# Patient Record
Sex: Female | Born: 1965 | Race: White | Hispanic: No | Marital: Married | Smoking: Never smoker
Health system: Southern US, Community
[De-identification: ages and names within clinical notes are randomized; demographics above are authoritative.]

---

## 1997-12-27 ENCOUNTER — Ambulatory Visit (HOSPITAL_COMMUNITY): Admission: RE | Admit: 1997-12-27 | Discharge: 1997-12-27 | Payer: Self-pay | Admitting: Obstetrics and Gynecology

## 1998-02-06 ENCOUNTER — Inpatient Hospital Stay (HOSPITAL_COMMUNITY): Admission: AD | Admit: 1998-02-06 | Discharge: 1998-02-22 | Payer: Self-pay | Admitting: Obstetrics and Gynecology

## 1998-03-30 ENCOUNTER — Other Ambulatory Visit: Admission: RE | Admit: 1998-03-30 | Discharge: 1998-03-30 | Payer: Self-pay | Admitting: Obstetrics and Gynecology

## 1999-05-09 ENCOUNTER — Ambulatory Visit (HOSPITAL_COMMUNITY): Admission: RE | Admit: 1999-05-09 | Discharge: 1999-05-09 | Payer: Self-pay | Admitting: Obstetrics and Gynecology

## 1999-08-19 ENCOUNTER — Other Ambulatory Visit: Admission: RE | Admit: 1999-08-19 | Discharge: 1999-08-19 | Payer: Self-pay | Admitting: Obstetrics and Gynecology

## 1999-11-22 ENCOUNTER — Encounter: Payer: Self-pay | Admitting: Obstetrics and Gynecology

## 1999-11-22 ENCOUNTER — Inpatient Hospital Stay (HOSPITAL_COMMUNITY): Admission: AD | Admit: 1999-11-22 | Discharge: 1999-11-22 | Payer: Self-pay | Admitting: Obstetrics and Gynecology

## 2000-01-17 ENCOUNTER — Other Ambulatory Visit: Admission: RE | Admit: 2000-01-17 | Discharge: 2000-01-17 | Payer: Self-pay | Admitting: Obstetrics and Gynecology

## 2011-06-02 ENCOUNTER — Ambulatory Visit (HOSPITAL_BASED_OUTPATIENT_CLINIC_OR_DEPARTMENT_OTHER)
Admission: RE | Admit: 2011-06-02 | Discharge: 2011-06-02 | Disposition: A | Discharge status: Home / Self Care | Payer: BC Managed Care – PPO | Source: Ambulatory Visit | Attending: Family Medicine | Admitting: Family Medicine

## 2011-06-02 ENCOUNTER — Encounter: Payer: Self-pay | Admitting: Family Medicine

## 2011-06-02 ENCOUNTER — Ambulatory Visit (INDEPENDENT_AMBULATORY_CARE_PROVIDER_SITE_OTHER): Payer: BC Managed Care – PPO | Admitting: Family Medicine

## 2011-06-02 VITALS — BP 124/77 | HR 79 | Ht 63.0 in | Wt 156.6 lb

## 2011-06-02 DIAGNOSIS — M79671 Pain in right foot: Secondary | ICD-10-CM

## 2011-06-02 DIAGNOSIS — M2010 Hallux valgus (acquired), unspecified foot: Secondary | ICD-10-CM | POA: Insufficient documentation

## 2011-06-02 DIAGNOSIS — M25579 Pain in unspecified ankle and joints of unspecified foot: Secondary | ICD-10-CM

## 2011-06-02 DIAGNOSIS — M949 Disorder of cartilage, unspecified: Secondary | ICD-10-CM | POA: Insufficient documentation

## 2011-06-02 DIAGNOSIS — M7730 Calcaneal spur, unspecified foot: Secondary | ICD-10-CM | POA: Insufficient documentation

## 2011-06-02 DIAGNOSIS — M79609 Pain in unspecified limb: Secondary | ICD-10-CM | POA: Insufficient documentation

## 2011-06-02 DIAGNOSIS — M25571 Pain in right ankle and joints of right foot: Secondary | ICD-10-CM | POA: Insufficient documentation

## 2011-06-02 DIAGNOSIS — M899 Disorder of bone, unspecified: Secondary | ICD-10-CM | POA: Insufficient documentation

## 2011-06-02 NOTE — Patient Instructions (Signed)
You have an ankle sprain. Ice the area for 15 minutes at a time, 3-4 times a day Take aleve 1-2 tabs twice a day with food as needed. Use lace-up ankle brace when walking/exercising on uneven terrain. Start physical therapy for 2-3 visits to learn home exercise program and do these exercises most days of the week for the next 6 weeks. Ok to run as long as not limping and pain is less than a 3 on a scale of 1-10. Follow-up with me in 4 weeks or as needed.

## 2011-06-02 NOTE — Assessment & Plan Note (Signed)
Right ankle/foot sprain - x-rays reviewed and no evidence of fracture line on ap/lateral/oblique views of foot (where she has bony tenderness though minimal at base of 5th and ? Prominence). Start PT with transition to HEP. Continue ASO when on uneven terrain. Icing, tylenol, aleve, elevation as needed.

## 2011-06-02 NOTE — Progress Notes (Signed)
  Subjective:    Patient ID: Cassandra Mccormick, female    DOB: 1966-09-30, 45 y.o.   MRN: [id number]  PCP: Dr. Fuller Mandril  HPI 45 yo F here for right foot/ankle pain.  Patient reports 6 weeks ago while doing a mud run through a creek she inverted her right ankle. Unable to bear weight for a few days following this. Went to [facility] and had x-rays that she reports were negative for a fracture. Was on crutches for the first few days then eventually weaned off of these to an ASO. Was icing, elevating but not requiring this now. Walked for the first time for exercise this past weekend and felt some soreness outside right foot/ankle. Planning to run a half marathon in October and wanted to make sure she was progressing as expected with this. Occasionally taking advil. Has not done any rehab for this. No prior ankle injuries or surgeries.  History reviewed. No pertinent past medical history.  No current outpatient prescriptions on file prior to visit.    Past Surgical History  Procedure Date  . Cesarean section     No Known Allergies  History   Social History  . Marital Status: Married    Spouse Name: N/A    Number of Children: N/A  . Years of Education: N/A   Occupational History  . Not on file.   Social History Main Topics  . Smoking status: Never Smoker   . Smokeless tobacco: Not on file  . Alcohol Use: Not on file  . Drug Use: Not on file  . Sexually Active: Not on file   Other Topics Concern  . Not on file   Social History Narrative  . No narrative on file    Family History  Problem Relation Age of Onset  . Hypertension Mother   . Stroke Father     BP 124/77  Pulse 79  Ht 5\' 3"  (1.6 m)  Wt 156 lb 9.6 oz (71.033 kg)  BMI 27.74 kg/m2  Review of Systems See HPI above.    Objective:   Physical Exam Gen: NAD R foot/ankle: ?bony callus base 5th MT compared to left (feels more swollen and firm).  No bruising, swelling, erythema, other  deformity. Minimal TTP base 5th, at ATFL.  No TTP fibular head, malleoli, navicular, elsewhere foot or ankle. FROM ankle with 5/5 strength all motions. 1+ ant drawer and talar tilt (same on left but she reports feels unstable with both of these only on right). NVI distally.     Assessment & Plan:  1. Right ankle/foot sprain - x-rays reviewed and no evidence of fracture line on ap/lateral/oblique views of foot (where she has bony tenderness though minimal at base of 5th and ? Prominence).  Start PT with transition to HEP.  Continue ASO when on uneven terrain.  Icing, tylenol, aleve, elevation as needed.

## 2011-06-02 NOTE — Assessment & Plan Note (Signed)
Right ankle/foot sprain - x-rays reviewed and no evidence of fracture line on ap/lateral/oblique views of foot (where she has bony tenderness though minimal at base of 5th and ? Prominence). Start PT with transition to HEP. Continue ASO when on uneven terrain. Icing, tylenol, aleve, elevation as needed.  

## 2011-06-06 ENCOUNTER — Ambulatory Visit: Payer: Self-pay | Admitting: Family Medicine

## 2011-07-09 ENCOUNTER — Ambulatory Visit (INDEPENDENT_AMBULATORY_CARE_PROVIDER_SITE_OTHER): Payer: BC Managed Care – PPO | Admitting: Family Medicine

## 2011-07-09 ENCOUNTER — Encounter: Payer: Self-pay | Admitting: Family Medicine

## 2011-07-09 VITALS — BP 114/77 | HR 68 | Temp 98.5°F | Ht 63.0 in | Wt 151.0 lb

## 2011-07-09 DIAGNOSIS — M25579 Pain in unspecified ankle and joints of unspecified foot: Secondary | ICD-10-CM

## 2011-07-09 DIAGNOSIS — M25571 Pain in right ankle and joints of right foot: Secondary | ICD-10-CM

## 2011-07-09 NOTE — Assessment & Plan Note (Signed)
Patient is 3 months out from her inversion injury and still reporting fairly significant lateral ankle pain, swelling despite 6 weeks of PT/HEP.  Would expect more improvement than this at this point.  Will move forward with MRI to assess for stress fracture, talar dome fracture, or other abnormality to account for her lateral ankle pain and swelling that has persisted.  Icing, tylenol, aleve in meantime.  Will call her with results.

## 2011-07-09 NOTE — Progress Notes (Addendum)
Subjective:    Patient ID: Cassandra Mccormick, female    DOB: 12-06-1965, 45 y.o.   MRN: [id number]  PCP: Dr. Fuller Mandril  HPI  45 yo F here for f/u right foot/ankle pain.  7/16: Patient reports 6 weeks ago while doing a mud run through a creek she inverted her right ankle. Unable to bear weight for a few days following this. Went to [facility] and had x-rays that she reports were negative for a fracture. Was on crutches for the first few days then eventually weaned off of these to an ASO. Was icing, elevating but not requiring this now. Walked for the first time for exercise this past weekend and felt some soreness outside right foot/ankle. Planning to run a half marathon in October and wanted to make sure she was progressing as expected with this. Occasionally taking advil. Has not done any rehab for this. No prior ankle injuries or surgeries.  8/22: Patient returns for 6 week follow-up of right ankle pain. She has completed physical therapy and has been compliant with home exercise program. Not as active as she would like to be as she still reports pain in lateral ankle. Runs about 3 days a week with 4-5/10 pain lateral ankle and swelling following activity. Pain worse at nighttime. Taking aleve which helped a lot at first but not as much now. Icing after running. Affecting her sleep.  History reviewed. No pertinent past medical history.  No current outpatient prescriptions on file prior to visit.    Past Surgical History  Procedure Date  . Cesarean section     No Known Allergies  History   Social History  . Marital Status: Married    Spouse Name: N/A    Number of Children: N/A  . Years of Education: N/A   Occupational History  . Not on file.   Social History Main Topics  . Smoking status: Never Smoker   . Smokeless tobacco: Not on file  . Alcohol Use: Not on file  . Drug Use: Not on file  . Sexually Active: Not on file   Other Topics  Concern  . Not on file   Social History Narrative  . No narrative on file    Family History  Problem Relation Age of Onset  . Hypertension Mother   . Stroke Father     BP 114/77  Pulse 68  Temp(Src) 98.5 F (36.9 C) (Oral)  Ht 5\' 3"  (1.6 m)  Wt 151 lb (68.493 kg)  BMI 26.75 kg/m2  Review of Systems  See HPI above.    Objective:   Physical Exam  Gen: NAD R foot/ankle: No gross deformity, mild lateral ankle swelling.  No bruising, erythema, deformity. TTP lateral malleolus, over ATFL, lateral talar dome.  No base 5th, fibular head, medial malleolus, navicular, cuboid TTP. FROM ankle with 5/5 strength all motions. 1+ ant drawer and talar tilt. NVI distally.     Assessment & Plan:  1. Right ankle pain - Patient is 3 months out from her inversion injury and still reporting fairly significant lateral ankle pain, swelling despite 6 weeks of PT/HEP.  Would expect more improvement than this at this point.  Will move forward with MRI to assess for stress fracture, talar dome fracture, or other abnormality to account for her lateral ankle pain and swelling that has persisted.  Icing, tylenol, aleve in meantime.  Will call her with results.  Addendum:  MRI results reviewed - they do show a healing  nondisplaced cuboid fracture.  She came in today as I discussed I would like her to use a cam walker for next 6 weeks.  This is an unusual finding given she does not have tenderness here (examined again today and no tenderness over cuboid) and has an isolated cuboid fracture (? Calcaneal stress component).  Hopefully with rest over next 6 weeks her pain will completely resolve.  Do not think we need to move forward with surgical referral - these typically heal well without surgical intervention and MRI shows healing.  No running, elliptical, biking over next 4 weeks - consider adding biking if no pain in 4 weeks but will reassess in 2 weeks.  Not typical for pain to extend beyond 6-8 weeks but  this may be due to her activity level.  F/u in 2 weeks.

## 2011-07-11 ENCOUNTER — Ambulatory Visit
Admission: RE | Admit: 2011-07-11 | Discharge: 2011-07-11 | Disposition: A | Discharge status: Home / Self Care | Payer: BC Managed Care – PPO | Source: Ambulatory Visit | Attending: Family Medicine | Admitting: Family Medicine

## 2011-07-11 DIAGNOSIS — M25571 Pain in right ankle and joints of right foot: Secondary | ICD-10-CM

## 2011-07-24 ENCOUNTER — Ambulatory Visit (INDEPENDENT_AMBULATORY_CARE_PROVIDER_SITE_OTHER): Payer: BC Managed Care – PPO | Admitting: Family Medicine

## 2011-07-24 ENCOUNTER — Encounter: Payer: Self-pay | Admitting: Family Medicine

## 2011-07-24 VITALS — BP 93/50

## 2011-07-24 DIAGNOSIS — M79609 Pain in unspecified limb: Secondary | ICD-10-CM

## 2011-07-24 DIAGNOSIS — M79671 Pain in right foot: Secondary | ICD-10-CM

## 2011-07-24 NOTE — Progress Notes (Signed)
Subjective:    Patient ID: Cassandra Mccormick, female    DOB: 1966-04-27, 45 y.o.   MRN: [id number]  PCP: Dr. Fuller Mandril  Foot Pain   45 yo F here for f/u right foot/ankle pain.  7/16: Patient reports 6 weeks ago while doing a mud run through a creek she inverted her right ankle. Unable to bear weight for a few days following this. Went to [facility] and had x-rays that she reports were negative for a fracture. Was on crutches for the first few days then eventually weaned off of these to an ASO. Was icing, elevating but not requiring this now. Walked for the first time for exercise this past weekend and felt some soreness outside right foot/ankle. Planning to run a half marathon in October and wanted to make sure she was progressing as expected with this. Occasionally taking advil. Has not done any rehab for this. No prior ankle injuries or surgeries.  8/22: Patient returns for 6 week follow-up of right ankle pain. She has completed physical therapy and has been compliant with home exercise program. Not as active as she would like to be as she still reports pain in lateral ankle. Runs about 3 days a week with 4-5/10 pain lateral ankle and swelling following activity. Pain worse at nighttime. Taking aleve which helped a lot at first but not as much now. Icing after running. Affecting her sleep.  9/6: Patient has improved since last visit though mildly. No icing or medications regularly - occasionally taking aleve. Feels most stiff in the morning. Wearing cam walker regularly. Not doing any running, elliptical, cycling. Most of pain still at ankle and not at area of fracture noted on MRI.  History reviewed. No pertinent past medical history.  No current outpatient prescriptions on file prior to visit.    Past Surgical History  Procedure Date  . Cesarean section     No Known Allergies  History   Social History  . Marital Status: Married    Spouse  Name: N/A    Number of Children: N/A  . Years of Education: N/A   Occupational History  . Not on file.   Social History Main Topics  . Smoking status: Never Smoker   . Smokeless tobacco: Not on file  . Alcohol Use: Not on file  . Drug Use: Not on file  . Sexually Active: Not on file   Other Topics Concern  . Not on file   Social History Narrative  . No narrative on file    Family History  Problem Relation Age of Onset  . Hypertension Mother   . Stroke Father     BP 93/50  LMP 06/18/2011  Review of Systems  See HPI above.    Objective:   Physical Exam  Gen: NAD R foot/ankle: No gross deformity, minimal lateral ankle swelling.  No bruising, erythema, deformity. Mild TTP over ATFL, no TTP lateral malleolus, lateral talar dome.  No base 5th, fibular head, medial malleolus, navicular, cuboid TTP. FROM ankle with 5/5 strength all motions. 1+ ant drawer and talar tilt. NVI distally.     Assessment & Plan:  1. Right ankle pain - MRI showed cuboid fracture and possible stress reaction of calcaneus.  On exam again today patient is not tender here but treating her for these with 6 weeks of cam walker, relative rest (no weight bearing exercise).  Again, these typically heal well with conservative treatment and do not require surgical intervention.  F/u  in 4 weeks for reevaluation.  Advised her would continue with no running, elliptical, biking until follow-up.  Elevation, icing as needed.

## 2011-07-24 NOTE — Assessment & Plan Note (Signed)
MRI showed cuboid fracture and possible stress reaction of calcaneus.  On exam again today patient is not tender here but treating her for these with 6 weeks of cam walker, relative rest (no weight bearing exercise).  Again, these typically heal well with conservative treatment and do not require surgical intervention.  F/u in 4 weeks for reevaluation.  Advised her would continue with no running, elliptical, biking until follow-up.  Elevation, icing as needed.

## 2011-08-22 ENCOUNTER — Encounter: Payer: Self-pay | Admitting: Family Medicine

## 2011-08-22 ENCOUNTER — Ambulatory Visit (INDEPENDENT_AMBULATORY_CARE_PROVIDER_SITE_OTHER): Payer: BC Managed Care – PPO | Admitting: Family Medicine

## 2011-08-22 ENCOUNTER — Ambulatory Visit (HOSPITAL_BASED_OUTPATIENT_CLINIC_OR_DEPARTMENT_OTHER)
Admission: RE | Admit: 2011-08-22 | Discharge: 2011-08-22 | Disposition: A | Discharge status: Home / Self Care | Payer: BC Managed Care – PPO | Source: Ambulatory Visit | Attending: Family Medicine | Admitting: Family Medicine

## 2011-08-22 VITALS — BP 118/81 | HR 83 | Temp 98.3°F | Ht 63.0 in | Wt 150.0 lb

## 2011-08-22 DIAGNOSIS — M25579 Pain in unspecified ankle and joints of unspecified foot: Secondary | ICD-10-CM

## 2011-08-22 DIAGNOSIS — M25571 Pain in right ankle and joints of right foot: Secondary | ICD-10-CM

## 2011-08-22 DIAGNOSIS — M79671 Pain in right foot: Secondary | ICD-10-CM

## 2011-08-22 DIAGNOSIS — M79609 Pain in unspecified limb: Secondary | ICD-10-CM

## 2011-08-22 DIAGNOSIS — S8290XD Unspecified fracture of unspecified lower leg, subsequent encounter for closed fracture with routine healing: Secondary | ICD-10-CM

## 2011-08-22 NOTE — Progress Notes (Signed)
Subjective:    Patient ID: Cassandra Mccormick, female    DOB: [birth year], 45 y.o.   MRN: [id number]  PCP: Dr. Fuller Mandril  Foot Pain   45 yo F here for f/u right foot/ankle pain.  7/16: Patient reports 6 weeks ago while doing a mud run through a creek she inverted her right ankle. Unable to bear weight for a few days following this. Went to [facility] and had x-rays that she reports were negative for a fracture. Was on crutches for the first few days then eventually weaned off of these to an ASO. Was icing, elevating but not requiring this now. Walked for the first time for exercise this past weekend and felt some soreness outside right foot/ankle. Planning to run a half marathon in October and wanted to make sure she was progressing as expected with this. Occasionally taking advil. Has not done any rehab for this. No prior ankle injuries or surgeries.  8/22: Patient returns for 6 week follow-up of right ankle pain. She has completed physical therapy and has been compliant with home exercise program. Not as active as she would like to be as she still reports pain in lateral ankle. Runs about 3 days a week with 4-5/10 pain lateral ankle and swelling following activity. Pain worse at nighttime. Taking aleve which helped a lot at first but not as much now. Icing after running. Affecting her sleep.  9/6: Patient has improved since last visit though mildly. No icing or medications regularly - occasionally taking aleve. Feels most stiff in the morning. Wearing cam walker regularly. Not doing any running, elliptical, cycling. Most of pain still at ankle and not at area of fracture noted on MRI.  10/5: Patient reports she is no longer having pain in foot/ankle as of 3 weeks ago. Occasionally stiff in mornings. Wearing cam walker regularly No longer taking any meds or icing.  History reviewed. No pertinent past medical history.  No current outpatient prescriptions  on file prior to visit.    Past Surgical History  Procedure Date  . Cesarean section     No Known Allergies  History   Social History  . Marital Status: Married    Spouse Name: N/A    Number of Children: N/A  . Years of Education: N/A   Occupational History  . Not on file.   Social History Main Topics  . Smoking status: Never Smoker   . Smokeless tobacco: Not on file  . Alcohol Use: Not on file  . Drug Use: Not on file  . Sexually Active: Not on file   Other Topics Concern  . Not on file   Social History Narrative  . No narrative on file    Family History  Problem Relation Age of Onset  . Hypertension Mother   . Stroke Father     BP 118/81  Pulse 83  Temp(Src) 98.3 F (36.8 C) (Oral)  Ht 5\' 3"  (1.6 m)  Wt 150 lb (68.04 kg)  BMI 26.57 kg/m2  Review of Systems  See HPI above.    Objective:   Physical Exam  Gen: NAD R foot/ankle: No gross deformity, minimal lateral ankle swelling.  No bruising, erythema, deformity. No TTP over ATFL, no TTP lateral malleolus, lateral talar dome.  No base 5th, fibular head, medial malleolus, navicular, cuboid TTP. FROM ankle with 5/5 strength all motions. Negative talar tilt and ant drawer. NVI distally.     Assessment & Plan:  1. Right ankle pain -  x-rays show cuboid fracture appears healed and patient no longer clinically having pain and exam completely normal at this point.  Ok to start walking for exercise, cycle, elliptical if not painful.  Stop cam walker, wear supportive shoes.  See instructions for further.

## 2011-08-22 NOTE — Patient Instructions (Signed)
For next 2 weeks it's ok to start walking for exercise as well as cycle and do elliptical. No running for another 2 weeks still. In 2 weeks start walk:jog 1:1 for total of 10 minutes.  Increase each time by 1 minute of jogging, 5 total minutes of exercise (i.e. 2 jog: 1 walk for 15 minutes; then 3 jog: 1 walk for 20 minutes, etc).  Do this every other day for 4 weeks then resume normal exercise program. Stop wearing cam walker and switch to supportive shoes. Ice outside of foot/ankle for 15 minutes after exercise. Tylenol/aleve as needed for pain. Add back theraband exercises for ankle 3 sets of 10 each direction most days of the week. Follow up with me as needed.

## 2011-08-22 NOTE — Assessment & Plan Note (Signed)
x-rays show cuboid fracture appears healed and patient no longer clinically having pain and exam completely normal at this point.  Ok to start walking for exercise, cycle, elliptical if not painful.  Stop cam walker, wear supportive shoes.  See instructions for further. 

## 2011-08-22 NOTE — Assessment & Plan Note (Signed)
x-rays show cuboid fracture appears healed and patient no longer clinically having pain and exam completely normal at this point.  Ok to start walking for exercise, cycle, elliptical if not painful.  Stop cam walker, wear supportive shoes.  See instructions for further.

## 2018-09-29 NOTE — H&P (Signed)
NAME: Cassandra Mccormick, Cassandra Mccormick MEDICAL RECORD [id number] ACCOUNT [id number] DATE OF BIRTH:12-Nov-1966 FACILITY: [facility] LOCATION:  PHYSICIAN:Tarita Deshmukh Garry Heater, MD  HISTORY AND PHYSICAL  DATE OF ADMISSION:  10/18/2018  CHIEF COMPLAINT:  Menorrhagia secondary to fibroid.  HISTORY OF PRESENT ILLNESS:  A 52 year old G6, P2 perimenopausal patient.  Her partner had a vasectomy for over a year.  She has had some irregular bleeding.  Evaluation with ultrasound dated 05/19 initially showed possible adenomyosis.  Also, there was  a submucous fibroid noted in the lower uterine segment just above the internal os.  This was clarified by saline infusion that showed a 3.5 cm fibroid within the endometrial cavity in the lower uterine segment; however, due to this location and being  embedded within the myometrium, it was advised that complete resection from that site may be difficult.  We did discuss possible HTA or other options to control bleeding such as an IUD.  Ultimately on 06/19 the IUD was placed.  She had initial good response with some decreased bleeding; however, more recently continued to have episodes of heavier bleeding and presented at this time for definitive LAVH bilateral salpingectomy.  On 05/19  hemoglobin was 12.1 with an [place] of 14.  PAST MEDICAL HISTORY:  ALLERGIES:  None.   PRIOR SURGERY:  She has had one vaginal birth in 1997.  Three prior sections 99, 01 and 2002.  SOCIAL HISTORY:  She is married.  Denies drug or tobacco use, social alcohol use.  FAMILY HISTORY:  Significant for arthritis.  Otherwise, negative.  PHYSICAL EXAMINATION: VITAL SIGNS:  Temperature 98.2, blood pressure 132/76. HEENT:  Unremarkable. NECK:  Supple, without masses. LUNGS:  Clear. HEART:  Regular rate and rhythm without murmurs, rubs or gallops. BREASTS:  Without masses. ABDOMEN:  Soft, flat, nontender. PELVIC:  Vulva, vagina, cervix normal.  Uterus upper limit of normal size, mobile.  Adnexa  negative. NEUROLOGIC:  Unremarkable.  IMPRESSION:  Continued menorrhagia with a lower uterine segment fibroid, possible adenomyosis.  PLAN:  LAVH bilateral salpingectomy.  This procedure including the specific risks regarding bleeding, infection, transfusion, adjacent organ injury, wound infection, phlebitis along with her expected recovery time discussed.  The rationale for bilateral  salpingectomy reviewed with her also.  Possible need to complete the surgery by open technique was discussed with her likewise which she understands and accepts.  TN/NUANCE  D:09/28/2018 T:09/28/2018 JOB:[id number]

## 2018-10-11 NOTE — Patient Instructions (Addendum)
Your procedure is scheduled on   Monday 10/18/2018  Report to [place]. M.   Call this number if you have problems the morning of surgery  :[phone].   OUR ADDRESS IS [address].  WE ARE LOCATED IN [facility].                                     REMEMBER:  DO NOT EAT FOOD OR DRINK LIQUIDS AFTER MIDNIGHT .    TAKE THESE MEDICATIONS MORNING OF SURGERY WITH A SIP OF WATER:  none                                      DO NOT WEAR JEWERLY, MAKE UP, OR NAIL POLISH,  DO NOT WEAR LOTIONS, POWDERS, PERFUMES OR DEODORANT. DO NOT SHAVE FOR 24 HOURS PRIOR TO DAY OF SURGERY. MEN MAY SHAVE FACE AND NECK. CONTACTS, GLASSES, OR DENTURES MAY NOT BE WORN TO SURGERY.                                    [facility] IS NOT RESPONSIBLE  FOR ANY BELONGINGS.                                                                    Marland Kitchen                                                                                                    [facility] - Preparing for Surgery Before surgery, you can play an important role.  Because skin is not sterile, your skin needs to be as free of germs as possible.  You can reduce the number of germs on your skin by washing with CHG (chlorahexidine gluconate) soap before surgery.  CHG is an antiseptic cleaner which kills germs and bonds with the skin to continue killing germs even after washing. Please DO NOT use if you have an allergy to CHG or antibacterial soaps.  If your skin becomes reddened/irritated stop using the CHG and inform your nurse when you arrive at Short Stay. Do not shave (including legs and underarms) for at least 48 hours prior to the first CHG shower.  You may shave your face/neck. Please follow these instructions carefully:  1.  Shower with CHG Soap the night before surgery and the  morning of Surgery.  2.  If you choose to wash your hair, wash your hair first as usual with your  normal  shampoo.  3.  After  you shampoo, rinse your hair and body  thoroughly to remove the  shampoo.                           4.  Use CHG as you would any other liquid soap.  You can apply chg directly  to the skin and wash                       Gently with a scrungie or clean washcloth.  5.  Apply the CHG Soap to your body ONLY FROM THE NECK DOWN.   Do not use on face/ open                           Wound or open sores. Avoid contact with eyes, ears mouth and genitals (private parts).                       Wash face,  Genitals (private parts) with your normal soap.             6.  Wash thoroughly, paying special attention to the area where your surgery  will be performed.  7.  Thoroughly rinse your body with warm water from the neck down.  8.  DO NOT shower/wash with your normal soap after using and rinsing off  the CHG Soap.                9.  Pat yourself dry with a clean towel.            10.  Wear clean pajamas.            11.  Place clean sheets on your bed the night of your first shower and do not  sleep with pets. Day of Surgery : Do not apply any lotions/deodorants the morning of surgery.  Please wear clean clothes to the hospital/surgery center.  FAILURE TO FOLLOW THESE INSTRUCTIONS MAY RESULT IN THE CANCELLATION OF YOUR SURGERY PATIENT SIGNATURE_________________________________  NURSE SIGNATURE__________________________________  ________________________________________________________________________

## 2018-10-12 ENCOUNTER — Encounter (HOSPITAL_COMMUNITY): Payer: Self-pay

## 2018-10-12 ENCOUNTER — Other Ambulatory Visit: Payer: Self-pay

## 2018-10-12 ENCOUNTER — Encounter (HOSPITAL_COMMUNITY)
Admission: RE | Admit: 2018-10-12 | Discharge: 2018-10-12 | Disposition: A | Discharge status: Home / Self Care | Payer: BLUE CROSS/BLUE SHIELD | Source: Ambulatory Visit | Attending: Obstetrics and Gynecology | Admitting: Obstetrics and Gynecology

## 2018-10-12 DIAGNOSIS — Z01812 Encounter for preprocedural laboratory examination: Secondary | ICD-10-CM | POA: Insufficient documentation

## 2018-10-12 LAB — HCG, SERUM, QUALITATIVE: PREG SERUM: NEGATIVE

## 2018-10-12 LAB — CBC
HCT: 43.7 % (ref 36.0–46.0)
HEMOGLOBIN: 14.2 g/dL (ref 12.0–15.0)
MCH: 29.2 pg (ref 26.0–34.0)
MCHC: 32.5 g/dL (ref 30.0–36.0)
MCV: 89.9 fL (ref 80.0–100.0)
NRBC: 0 % (ref 0.0–0.2)
PLATELETS: 316 10*3/uL (ref 150–400)
RBC: 4.86 MIL/uL (ref 3.87–5.11)
RDW: 13.7 % (ref 11.5–15.5)
WBC: 4.6 10*3/uL (ref 4.0–10.5)

## 2018-10-18 ENCOUNTER — Inpatient Hospital Stay (HOSPITAL_BASED_OUTPATIENT_CLINIC_OR_DEPARTMENT_OTHER)
Admission: RE | Admit: 2018-10-18 | Discharge: 2018-10-20 | DRG: 743 | Disposition: A | Discharge status: Home / Self Care | Payer: BLUE CROSS/BLUE SHIELD | Attending: Obstetrics and Gynecology | Admitting: Obstetrics and Gynecology

## 2018-10-18 ENCOUNTER — Encounter (HOSPITAL_BASED_OUTPATIENT_CLINIC_OR_DEPARTMENT_OTHER): Payer: Self-pay | Admitting: *Deleted

## 2018-10-18 ENCOUNTER — Ambulatory Visit (HOSPITAL_BASED_OUTPATIENT_CLINIC_OR_DEPARTMENT_OTHER): Payer: BLUE CROSS/BLUE SHIELD | Admitting: Certified Registered"

## 2018-10-18 ENCOUNTER — Encounter (HOSPITAL_COMMUNITY)
Admission: RE | Disposition: A | Discharge status: Home / Self Care | Payer: Self-pay | Source: Home / Self Care | Attending: Obstetrics and Gynecology

## 2018-10-18 DIAGNOSIS — N920 Excessive and frequent menstruation with regular cycle: Secondary | ICD-10-CM | POA: Diagnosis present

## 2018-10-18 DIAGNOSIS — N858 Other specified noninflammatory disorders of uterus: Secondary | ICD-10-CM | POA: Diagnosis present

## 2018-10-18 DIAGNOSIS — D219 Benign neoplasm of connective and other soft tissue, unspecified: Secondary | ICD-10-CM | POA: Diagnosis present

## 2018-10-18 DIAGNOSIS — Z975 Presence of (intrauterine) contraceptive device: Secondary | ICD-10-CM

## 2018-10-18 DIAGNOSIS — D250 Submucous leiomyoma of uterus: Principal | ICD-10-CM | POA: Diagnosis present

## 2018-10-18 DIAGNOSIS — Z5331 Laparoscopic surgical procedure converted to open procedure: Secondary | ICD-10-CM

## 2018-10-18 DIAGNOSIS — N736 Female pelvic peritoneal adhesions (postinfective): Secondary | ICD-10-CM | POA: Diagnosis present

## 2018-10-18 HISTORY — PX: LAPAROSCOPIC VAGINAL HYSTERECTOMY WITH SALPINGECTOMY: SHX6680

## 2018-10-18 LAB — ABO/RH: ABO/RH(D): A NEG

## 2018-10-18 LAB — CBC
HCT: 42.1 % (ref 36.0–46.0)
Hemoglobin: 13.8 g/dL (ref 12.0–15.0)
MCH: 29.9 pg (ref 26.0–34.0)
MCHC: 32.8 g/dL (ref 30.0–36.0)
MCV: 91.1 fL (ref 80.0–100.0)
NRBC: 0 % (ref 0.0–0.2)
Platelets: 302 10*3/uL (ref 150–400)
RBC: 4.62 MIL/uL (ref 3.87–5.11)
RDW: 13.8 % (ref 11.5–15.5)
WBC: 4.8 10*3/uL (ref 4.0–10.5)

## 2018-10-18 LAB — TYPE AND SCREEN
ABO/RH(D): A NEG
Antibody Screen: NEGATIVE

## 2018-10-18 LAB — POCT PREGNANCY, URINE: Preg Test, Ur: NEGATIVE

## 2018-10-18 SURGERY — HYSTERECTOMY, VAGINAL, LAPAROSCOPY-ASSISTED, WITH SALPINGECTOMY
Anesthesia: General | Site: Abdomen | Laterality: Bilateral

## 2018-10-18 MED ORDER — SODIUM CHLORIDE 0.9% FLUSH
9.0000 mL | INTRAVENOUS | Status: DC | PRN
  Filled 2018-10-18: qty 10

## 2018-10-18 MED ORDER — WHITE PETROLATUM EX OINT
TOPICAL_OINTMENT | CUTANEOUS | Status: AC
  Filled 2018-10-18: qty 5

## 2018-10-18 MED ORDER — MIDAZOLAM HCL 2 MG/2ML IJ SOLN
INTRAMUSCULAR | Status: AC
  Filled 2018-10-18: qty 2

## 2018-10-18 MED ORDER — SUGAMMADEX SODIUM 200 MG/2ML IV SOLN
INTRAVENOUS | Status: DC | PRN
  Administered 2018-10-18: 150 mg via INTRAVENOUS

## 2018-10-18 MED ORDER — NALOXONE HCL 0.4 MG/ML IJ SOLN
0.4000 mg | INTRAMUSCULAR | Status: DC | PRN
  Filled 2018-10-18: qty 1

## 2018-10-18 MED ORDER — FENTANYL CITRATE (PF) 100 MCG/2ML IJ SOLN
INTRAMUSCULAR | Status: AC
  Filled 2018-10-18: qty 2

## 2018-10-18 MED ORDER — HEMOSTATIC AGENTS (NO CHARGE) OPTIME
TOPICAL | Status: DC | PRN
  Administered 2018-10-18: 1 via TOPICAL

## 2018-10-18 MED ORDER — ONDANSETRON HCL 4 MG PO TABS
4.0000 mg | ORAL_TABLET | Freq: Four times a day (QID) | ORAL | Status: DC | PRN
  Filled 2018-10-18: qty 1

## 2018-10-18 MED ORDER — ROCURONIUM BROMIDE 10 MG/ML (PF) SYRINGE
PREFILLED_SYRINGE | INTRAVENOUS | Status: AC
  Filled 2018-10-18: qty 10

## 2018-10-18 MED ORDER — BUPIVACAINE HCL (PF) 0.25 % IJ SOLN
INTRAMUSCULAR | Status: DC | PRN
  Administered 2018-10-18: 8 mL

## 2018-10-18 MED ORDER — PROPOFOL 10 MG/ML IV BOLUS
INTRAVENOUS | Status: AC
  Filled 2018-10-18: qty 20

## 2018-10-18 MED ORDER — DEXAMETHASONE SODIUM PHOSPHATE 10 MG/ML IJ SOLN
INTRAMUSCULAR | Status: DC | PRN
  Administered 2018-10-18: 10 mg via INTRAVENOUS

## 2018-10-18 MED ORDER — FENTANYL CITRATE (PF) 100 MCG/2ML IJ SOLN
INTRAMUSCULAR | Status: AC
  Filled 2018-10-18: qty 4

## 2018-10-18 MED ORDER — PROPOFOL 10 MG/ML IV BOLUS
INTRAVENOUS | Status: DC | PRN
  Administered 2018-10-18: 180 mg via INTRAVENOUS

## 2018-10-18 MED ORDER — IBUPROFEN 800 MG PO TABS
800.0000 mg | ORAL_TABLET | Freq: Three times a day (TID) | ORAL | Status: DC | PRN
  Administered 2018-10-20: 800 mg via ORAL
  Filled 2018-10-18 (×2): qty 1

## 2018-10-18 MED ORDER — ONDANSETRON HCL 4 MG/2ML IJ SOLN
INTRAMUSCULAR | Status: AC
  Filled 2018-10-18: qty 2

## 2018-10-18 MED ORDER — HYDROMORPHONE HCL 1 MG/ML IJ SOLN
0.2500 mg | INTRAMUSCULAR | Status: DC | PRN
  Administered 2018-10-18 (×3): 0.5 mg via INTRAVENOUS
  Filled 2018-10-18: qty 0.5

## 2018-10-18 MED ORDER — MORPHINE SULFATE 2 MG/ML IV SOLN
INTRAVENOUS | Status: DC
  Administered 2018-10-18: 12:00:00 via INTRAVENOUS
  Administered 2018-10-18: 3 mg via INTRAVENOUS
  Administered 2018-10-18: 10 mg via INTRAVENOUS
  Administered 2018-10-19 (×2): 3 mg via INTRAVENOUS
  Administered 2018-10-19: 2 mg via INTRAVENOUS
  Filled 2018-10-18 (×2): qty 30

## 2018-10-18 MED ORDER — KETOROLAC TROMETHAMINE 30 MG/ML IJ SOLN
INTRAMUSCULAR | Status: AC
  Filled 2018-10-18: qty 1

## 2018-10-18 MED ORDER — LACTATED RINGERS IV SOLN
INTRAVENOUS | Status: DC | PRN
  Administered 2018-10-18: 08:00:00 via INTRAVENOUS

## 2018-10-18 MED ORDER — ONDANSETRON HCL 4 MG/2ML IJ SOLN
4.0000 mg | Freq: Once | INTRAMUSCULAR | Status: DC | PRN
  Filled 2018-10-18: qty 2

## 2018-10-18 MED ORDER — KETOROLAC TROMETHAMINE 30 MG/ML IJ SOLN
30.0000 mg | Freq: Four times a day (QID) | INTRAMUSCULAR | Status: DC
  Filled 2018-10-18: qty 1

## 2018-10-18 MED ORDER — MIDAZOLAM HCL 2 MG/2ML IJ SOLN
INTRAMUSCULAR | Status: DC | PRN
  Administered 2018-10-18: 2 mg via INTRAVENOUS

## 2018-10-18 MED ORDER — LIDOCAINE 2% (20 MG/ML) 5 ML SYRINGE
INTRAMUSCULAR | Status: DC | PRN
  Administered 2018-10-18: 60 mg via INTRAVENOUS

## 2018-10-18 MED ORDER — ONDANSETRON HCL 4 MG/2ML IJ SOLN
4.0000 mg | Freq: Four times a day (QID) | INTRAMUSCULAR | Status: DC | PRN
  Filled 2018-10-18: qty 2

## 2018-10-18 MED ORDER — DEXTROSE IN LACTATED RINGERS 5 % IV SOLN
INTRAVENOUS | Status: DC
  Administered 2018-10-18: 06:00:00 via INTRAVENOUS
  Filled 2018-10-18: qty 1000

## 2018-10-18 MED ORDER — SUGAMMADEX SODIUM 200 MG/2ML IV SOLN
INTRAVENOUS | Status: AC
  Filled 2018-10-18: qty 2

## 2018-10-18 MED ORDER — ONDANSETRON HCL 4 MG/2ML IJ SOLN
INTRAMUSCULAR | Status: DC | PRN
  Administered 2018-10-18: 4 mg via INTRAVENOUS

## 2018-10-18 MED ORDER — LIDOCAINE 2% (20 MG/ML) 5 ML SYRINGE
INTRAMUSCULAR | Status: AC
  Filled 2018-10-18: qty 5

## 2018-10-18 MED ORDER — SODIUM CHLORIDE 0.9 % IV SOLN
INTRAVENOUS | Status: AC
  Filled 2018-10-18: qty 2

## 2018-10-18 MED ORDER — HYDROMORPHONE HCL 1 MG/ML IJ SOLN
INTRAMUSCULAR | Status: AC
  Filled 2018-10-18: qty 1

## 2018-10-18 MED ORDER — DEXAMETHASONE SODIUM PHOSPHATE 10 MG/ML IJ SOLN
INTRAMUSCULAR | Status: AC
  Filled 2018-10-18: qty 1

## 2018-10-18 MED ORDER — DEXTROSE IN LACTATED RINGERS 5 % IV SOLN
INTRAVENOUS | Status: DC
  Administered 2018-10-18 – 2018-10-19 (×3): via INTRAVENOUS
  Filled 2018-10-18: qty 1000

## 2018-10-18 MED ORDER — ROCURONIUM BROMIDE 10 MG/ML (PF) SYRINGE
PREFILLED_SYRINGE | INTRAVENOUS | Status: DC | PRN
  Administered 2018-10-18: 10 mg via INTRAVENOUS
  Administered 2018-10-18: 50 mg via INTRAVENOUS

## 2018-10-18 MED ORDER — DIPHENHYDRAMINE HCL 12.5 MG/5ML PO ELIX
12.5000 mg | ORAL_SOLUTION | Freq: Four times a day (QID) | ORAL | Status: DC | PRN
  Filled 2018-10-18: qty 5

## 2018-10-18 MED ORDER — SODIUM CHLORIDE (PF) 0.9 % IJ SOLN
INTRAMUSCULAR | Status: DC | PRN
  Administered 2018-10-18: 50 mL

## 2018-10-18 MED ORDER — BUPIVACAINE LIPOSOME 1.3 % IJ SUSP
INTRAMUSCULAR | Status: DC | PRN
  Administered 2018-10-18: 20 mL

## 2018-10-18 MED ORDER — SODIUM CHLORIDE 0.9 % IV SOLN
2.0000 g | INTRAVENOUS | Status: AC
  Administered 2018-10-18: 2 g via INTRAVENOUS
  Filled 2018-10-18: qty 2

## 2018-10-18 MED ORDER — DIPHENHYDRAMINE HCL 50 MG/ML IJ SOLN
12.5000 mg | Freq: Four times a day (QID) | INTRAMUSCULAR | Status: DC | PRN
  Filled 2018-10-18: qty 0.25

## 2018-10-18 MED ORDER — KETOROLAC TROMETHAMINE 30 MG/ML IJ SOLN
INTRAMUSCULAR | Status: DC | PRN
  Administered 2018-10-18: 30 mg via INTRAVENOUS

## 2018-10-18 MED ORDER — FENTANYL CITRATE (PF) 100 MCG/2ML IJ SOLN
INTRAMUSCULAR | Status: DC | PRN
  Administered 2018-10-18 (×5): 50 ug via INTRAVENOUS

## 2018-10-18 MED ORDER — MENTHOL 3 MG MT LOZG
1.0000 | LOZENGE | OROMUCOSAL | Status: DC | PRN
  Administered 2018-10-18: 3 mg via ORAL
  Filled 2018-10-18 (×2): qty 9

## 2018-10-18 MED ORDER — MEPERIDINE HCL 25 MG/ML IJ SOLN
6.2500 mg | INTRAMUSCULAR | Status: DC | PRN
  Filled 2018-10-18: qty 1

## 2018-10-18 MED ORDER — KETOROLAC TROMETHAMINE 30 MG/ML IJ SOLN
30.0000 mg | Freq: Once | INTRAMUSCULAR | Status: DC
  Filled 2018-10-18: qty 1

## 2018-10-18 MED ORDER — KETOROLAC TROMETHAMINE 30 MG/ML IJ SOLN
30.0000 mg | Freq: Four times a day (QID) | INTRAMUSCULAR | Status: DC
  Administered 2018-10-18 – 2018-10-20 (×7): 30 mg via INTRAVENOUS
  Filled 2018-10-18 (×9): qty 1

## 2018-10-18 MED ORDER — BUTORPHANOL TARTRATE 1 MG/ML IJ SOLN
1.0000 mg | INTRAMUSCULAR | Status: DC | PRN
  Filled 2018-10-18: qty 2

## 2018-10-18 SURGICAL SUPPLY — 70 items
ADH SKN CLS APL DERMABOND .7 (GAUZE/BANDAGES/DRESSINGS) ×1
APL SRG 38 LTWT LNG FL B (MISCELLANEOUS) ×1
APPLICATOR ARISTA FLEXITIP XL (MISCELLANEOUS) ×2 IMPLANT
BLADE SURG 10 STRL SS (BLADE) ×2 IMPLANT
CABLE HIGH FREQUENCY MONO STRZ (ELECTRODE) IMPLANT
CATH ROBINSON RED A/P 16FR (CATHETERS) ×3 IMPLANT
CELLS DAT CNTRL 66122 CELL SVR (MISCELLANEOUS) ×1 IMPLANT
CONT PATH 16OZ SNAP LID 3702 (MISCELLANEOUS) IMPLANT
COVER BACK TABLE 60X90IN (DRAPES) ×3 IMPLANT
COVER MAYO STAND STRL (DRAPES) ×6 IMPLANT
COVER SURGICAL LIGHT HANDLE (MISCELLANEOUS) ×3 IMPLANT
COVER WAND RF STERILE (DRAPES) ×3 IMPLANT
DECANTER SPIKE VIAL GLASS SM (MISCELLANEOUS) IMPLANT
DERMABOND ADVANCED (GAUZE/BANDAGES/DRESSINGS) ×2
DERMABOND ADVANCED .7 DNX12 (GAUZE/BANDAGES/DRESSINGS) ×1 IMPLANT
DRAPE WARM FLUID 44X44 (DRAPE) ×2 IMPLANT
DRSG COVADERM PLUS 2X2 (GAUZE/BANDAGES/DRESSINGS) ×2 IMPLANT
DRSG OPSITE POSTOP 3X4 (GAUZE/BANDAGES/DRESSINGS) IMPLANT
DRSG OPSITE POSTOP 4X10 (GAUZE/BANDAGES/DRESSINGS) ×2 IMPLANT
DRSG TEGADERM 4X4.75 (GAUZE/BANDAGES/DRESSINGS) ×2 IMPLANT
DURAPREP 26ML APPLICATOR (WOUND CARE) ×3 IMPLANT
ELECT BLADE TIP CTD 4 INCH (ELECTRODE) ×2 IMPLANT
ELECT REM PT RETURN 9FT ADLT (ELECTROSURGICAL) ×3
ELECTRODE REM PT RTRN 9FT ADLT (ELECTROSURGICAL) ×1 IMPLANT
GAUZE 4X4 16PLY RFD (DISPOSABLE) ×3 IMPLANT
GAUZE SPONGE 4X4 12PLY STRL (GAUZE/BANDAGES/DRESSINGS) ×2 IMPLANT
GLOVE BIO SURGEON STRL SZ7 (GLOVE) ×6 IMPLANT
GLOVE BIOGEL PI IND STRL 7.0 (GLOVE) ×2 IMPLANT
GLOVE BIOGEL PI INDICATOR 7.0 (GLOVE) ×4
GLOVE ECLIPSE 6.5 STRL STRAW (GLOVE) ×3 IMPLANT
HEMOSTAT ARISTA ABSORB 3G PWDR (MISCELLANEOUS) ×2 IMPLANT
HOLDER FOLEY CATH W/STRAP (MISCELLANEOUS) ×3 IMPLANT
LIGASURE IMPACT 36 18CM CVD LR (INSTRUMENTS) IMPLANT
NEEDLE INSUFFLATION 120MM (ENDOMECHANICALS) ×3 IMPLANT
NS IRRIG 1000ML POUR BTL (IV SOLUTION) ×7 IMPLANT
NS IRRIG 500ML POUR BTL (IV SOLUTION) ×2 IMPLANT
PACK LAVH (CUSTOM PROCEDURE TRAY) ×3 IMPLANT
PACK ROBOTIC GOWN (GOWN DISPOSABLE) ×3 IMPLANT
PACK TRENDGUARD 450 HYBRID PRO (MISCELLANEOUS) IMPLANT
PENCIL BUTTON HOLSTER BLD 10FT (ELECTRODE) ×2 IMPLANT
PROTECTOR NERVE ULNAR (MISCELLANEOUS) ×6 IMPLANT
RETRACTOR WND ALEXIS 18 MED (MISCELLANEOUS) IMPLANT
RTRCTR WOUND ALEXIS 18CM MED (MISCELLANEOUS) ×3
SEALER TISSUE G2 CVD JAW 45CM (ENDOMECHANICALS) IMPLANT
SET IRRIG TUBING LAPAROSCOPIC (IRRIGATION / IRRIGATOR) IMPLANT
SLEEVE SURGEON STRL (DRAPES) ×2 IMPLANT
SUT MNCRL AB 4-0 PS2 18 (SUTURE) ×2 IMPLANT
SUT MON AB 2-0 CT1 36 (SUTURE) IMPLANT
SUT PDS AB 0 CT1 36 (SUTURE) ×4 IMPLANT
SUT VIC AB 0 CT1 18XCR BRD8 (SUTURE) ×2 IMPLANT
SUT VIC AB 0 CT1 27 (SUTURE) ×3
SUT VIC AB 0 CT1 27XCR 8 STRN (SUTURE) IMPLANT
SUT VIC AB 0 CT1 36 (SUTURE) ×3 IMPLANT
SUT VIC AB 0 CT1 8-18 (SUTURE) ×6
SUT VIC AB 2-0 CT1 (SUTURE) IMPLANT
SUT VIC AB 3-0 CT1 27 (SUTURE) ×6
SUT VIC AB 3-0 CT1 TAPERPNT 27 (SUTURE) IMPLANT
SUT VICRYL 0 TIES 12 18 (SUTURE) ×3 IMPLANT
SUT VICRYL 0 UR6 27IN ABS (SUTURE) ×2 IMPLANT
SUT VICRYL 4-0 PS2 18IN ABS (SUTURE) ×3 IMPLANT
SYR BULB IRRIGATION 50ML (SYRINGE) ×2 IMPLANT
TOWEL OR 17X24 6PK STRL BLUE (TOWEL DISPOSABLE) ×6 IMPLANT
TRAY FOLEY W/BAG SLVR 14FR (SET/KITS/TRAYS/PACK) ×3 IMPLANT
TRENDGUARD 450 HYBRID PRO PACK (MISCELLANEOUS)
TROCAR BLADELESS OPT 5 100 (ENDOMECHANICALS) ×3 IMPLANT
TROCAR OPTI TIP 5M 100M (ENDOMECHANICALS) ×3 IMPLANT
TROCAR XCEL DIL TIP R 11M (ENDOMECHANICALS) ×3 IMPLANT
TUBING INSUF HEATED (TUBING) ×3 IMPLANT
TUBING SUCTION 1/4X6FT (MISCELLANEOUS) ×3 IMPLANT
WARMER LAPAROSCOPE (MISCELLANEOUS) ×3 IMPLANT

## 2018-10-18 NOTE — Anesthesia Procedure Notes (Signed)
Procedure Name: Intubation Date/Time: 10/18/2018 7:28 AM Performed by: Suan Halter, CRNA Pre-anesthesia Checklist: Patient identified, Emergency Drugs available, Suction available and Patient being monitored Patient Re-evaluated:Patient Re-evaluated prior to induction Oxygen Delivery Method: Circle system utilized Preoxygenation: Pre-oxygenation with 100% oxygen Induction Type: IV induction Ventilation: Mask ventilation without difficulty Laryngoscope Size: Mac and 3 Grade View: Grade I Tube type: Oral Tube size: 7.0 mm Number of attempts: 1 Airway Equipment and Method: Stylet and Oral airway Placement Confirmation: ETT inserted through vocal cords under direct vision,  positive ETCO2 and breath sounds checked- equal and bilateral Secured at: 22 cm Tube secured with: Tape Dental Injury: Teeth and Oropharynx as per pre-operative assessment

## 2018-10-18 NOTE — Op Note (Signed)
Preoperative diagnosis menorrhagia, leiomyoma  Postoperative diagnosis: Same plus significant pelvic adhesions  Procedure: Diagnostic laparoscopy followed by TAH, lysis of adhesions, bilateral salpingectomy  Surgeon: Matthew Saras  Assistant: [place]  EBL: 150 cc  Procedure findings:  Patient was taken to the operating room after an adequate level of general anesthesia was obtained patient's legs in stirrups the abdomen perineum and vagina were prepped and draped Hulka tenaculum was positioned and Foley catheter was position.  This was done after appropriate timeouts were taken.  Attention to the directed to the abdomen the subumbilical area was infiltrated with quarter percent Marcaine plain a small incision was made and the varies needle was introduced without difficulty.  Its intra-abdominal position was verified by pressure water testing.  After 2-1/2 L pneumoperitoneum was then created, lap scopic trocar and sleeve were then introduced that difficulty.  The patient was then placed in Trendelenburg, there were significant anterior uterine to lower abdominal wall adhesions precluding in the manipulation of the uterus.  There were also some omental adhesions no evidence of any bowel adherent into the area of the anterior abdominal wall.  Once this was noted decision made to proceed with an open case.  The scope was removed gas allowed to escape and the incision closed with a 4-0 Vicryl subcuticular suture.  The legs were positioned downward Pfannenstiel incision was made through her old scar and ellipse of skin was removed due to scarring this is carried down to the fascia which was incised and extended transversely.  Rectus muscles divided in the midline peritoneum entered separately without incident and extended in a vertical fashion.  Some omental adhesions were lysed in an avascular plane.  The Alexis retractor was position.  Significant adhesions from the anterior uterine wall were dissected free  allowing mobilization of the uterus.  Posterior cul-de-sac was free and clear long Kelly clamps were then placed across each utero-ovarian pedicle starting on the left round ligament was clamped divided and suture-ligated temporarily held the utero-ovarian pedicle was then isolated first free tie followed by a suture of 0 Vicryl.  Thus the left ovary was conserved which appeared to be normal left salpingectomy was carried out with the clamp cut tied technique.  The exact same repeated on the opposite side thus conserving both ovaries.  Significant scarring in the area of her prior sections this required sharp dissection with Metzenbaum scissors staying on the anterior uterine wall until the proper plane was noted and the bladder was released below the cervical vaginal junction.  In sequential manner staying close to the uterus the descending branch of the uterine artery was clamped divided and suture-ligated followed by the cardinal ligament uterosacral ligament and cervical vaginal pedicles.  The specimen was thus excised.  The cuff was closed with interrupted 2-0 Vicryl sutures.  This was irrigated noted to be hemostatic Arista was placed at the cuff both ovaries were suspended to the round ligament with a solitary suture on each side.  Prior to closure sponge, needle, instrument counts reported as correct x2 peritoneum closed with 3-0 Vicryl running suture the same on the rectus muscles in the midline 0 PDS used to close the fascia laterally.  Diluted Exparel solution with quarter percent Marcaine and 40 cc of saline was then placed subfascially and subcutaneously for postoperative analgesia.  Subcu tissue was undermined to reduce tension this was irrigated and made hemostatic with the Bovie was not closed separately 4-0 Monocryl subcuticular skin closure with a pressure dressing was applied clear urine noted  at the end the case.  She tolerated this well went to recovery room in good condition.  Dictated with  [employer]  Margarette Asal MD

## 2018-10-18 NOTE — Anesthesia Preprocedure Evaluation (Signed)
Anesthesia Evaluation  Patient identified by MRN, date of birth, ID band Patient awake    Reviewed: Allergy & Precautions, NPO status , Patient's Chart, lab work & pertinent test results  Airway Mallampati: I  TM Distance: >3 FB Neck ROM: Full    Dental   Pulmonary    Pulmonary exam normal        Cardiovascular Normal cardiovascular exam     Neuro/Psych    GI/Hepatic   Endo/Other    Renal/GU      Musculoskeletal   Abdominal   Peds  Hematology   Anesthesia Other Findings   Reproductive/Obstetrics                             Anesthesia Physical Anesthesia Plan  ASA: II  Anesthesia Plan: General   Post-op Pain Management:    Induction: Intravenous  PONV Risk Score and Plan: 3 and Ondansetron, Dexamethasone and Midazolam  Airway Management Planned: Oral ETT  Additional Equipment:   Intra-op Plan:   Post-operative Plan: Extubation in OR  Informed Consent: I have reviewed the patients History and Physical, chart, labs and discussed the procedure including the risks, benefits and alternatives for the proposed anesthesia with the patient or authorized representative who has indicated his/her understanding and acceptance.       Plan Discussed with: CRNA and Surgeon  Anesthesia Plan Comments:         Anesthesia Quick Evaluation  

## 2018-10-18 NOTE — Anesthesia Postprocedure Evaluation (Signed)
Anesthesia Post Note  Patient: Cassandra Mccormick  Procedure(s) Performed: ATTEMPTED LAPAROSCOPIC  ASSISTED VAGINAL HYSTERECTOMY WITH SALPINGECTOMY CONVERTED TO OPEN (Bilateral Abdomen)     Patient location during evaluation: PACU Anesthesia Type: General Level of consciousness: awake and alert Pain management: pain level controlled Vital Signs Assessment: post-procedure vital signs reviewed and stable Respiratory status: spontaneous breathing, nonlabored ventilation, respiratory function stable and patient connected to nasal cannula oxygen Cardiovascular status: blood pressure returned to baseline and stable Postop Assessment: no apparent nausea or vomiting Anesthetic complications: no    Last Vitals:  Vitals:   10/18/18 1130 10/18/18 1203  BP: 136/80 (!) 144/76  Pulse: 70 63  Resp: 16 16  Temp: 36.9 C 36.9 C  SpO2: 98% 97%    Last Pain:  Vitals:   10/18/18 1203  TempSrc:   PainSc: 5                  Rhona Fusilier DAVID

## 2018-10-18 NOTE — Transfer of Care (Signed)
Immediate Anesthesia Transfer of Care Note  Patient: Cassandra Mccormick  Procedure(s) Performed: Procedure(s) (LRB): ATTEMPTED LAPAROSCOPIC  ASSISTED VAGINAL HYSTERECTOMY WITH SALPINGECTOMY CONVERTED TO OPEN (Bilateral)  Patient Location: PACU  Anesthesia Type: General  Level of Consciousness: awake, oriented, sedated and patient cooperative  Airway & Oxygen Therapy: Patient Spontanous Breathing and Patient connected to face mask oxygen  Post-op Assessment: Report given to PACU RN and Post -op Vital signs reviewed and stable  Post vital signs: Reviewed and stable  Complications: No apparent anesthesia complications Last Vitals:  Vitals Value Taken Time  BP 167/91 10/18/2018  9:05 AM  Temp    Pulse 59 10/18/2018  9:11 AM  Resp 14 10/18/2018  9:11 AM  SpO2 100 % 10/18/2018  9:11 AM  Vitals shown include unvalidated device data.  Last Pain:  Vitals:   10/18/18 0532  TempSrc: Oral

## 2018-10-18 NOTE — Progress Notes (Signed)
The patient was re-examined with no change in status 

## 2018-10-19 ENCOUNTER — Encounter (HOSPITAL_BASED_OUTPATIENT_CLINIC_OR_DEPARTMENT_OTHER): Payer: Self-pay | Admitting: Obstetrics and Gynecology

## 2018-10-19 DIAGNOSIS — D219 Benign neoplasm of connective and other soft tissue, unspecified: Secondary | ICD-10-CM | POA: Diagnosis present

## 2018-10-19 DIAGNOSIS — N858 Other specified noninflammatory disorders of uterus: Secondary | ICD-10-CM | POA: Diagnosis present

## 2018-10-19 DIAGNOSIS — N920 Excessive and frequent menstruation with regular cycle: Secondary | ICD-10-CM | POA: Diagnosis present

## 2018-10-19 DIAGNOSIS — Z5331 Laparoscopic surgical procedure converted to open procedure: Secondary | ICD-10-CM | POA: Diagnosis not present

## 2018-10-19 DIAGNOSIS — Z975 Presence of (intrauterine) contraceptive device: Secondary | ICD-10-CM | POA: Diagnosis not present

## 2018-10-19 DIAGNOSIS — N736 Female pelvic peritoneal adhesions (postinfective): Secondary | ICD-10-CM | POA: Diagnosis present

## 2018-10-19 DIAGNOSIS — D250 Submucous leiomyoma of uterus: Secondary | ICD-10-CM | POA: Diagnosis present

## 2018-10-19 LAB — CBC
HCT: 33.6 % — ABNORMAL LOW (ref 36.0–46.0)
Hemoglobin: 10.6 g/dL — ABNORMAL LOW (ref 12.0–15.0)
MCH: 29 pg (ref 26.0–34.0)
MCHC: 31.5 g/dL (ref 30.0–36.0)
MCV: 91.8 fL (ref 80.0–100.0)
Platelets: 252 10*3/uL (ref 150–400)
RBC: 3.66 MIL/uL — AB (ref 3.87–5.11)
RDW: 13.6 % (ref 11.5–15.5)
WBC: 7.8 10*3/uL (ref 4.0–10.5)
nRBC: 0 % (ref 0.0–0.2)

## 2018-10-19 MED ORDER — OXYCODONE-ACETAMINOPHEN 5-325 MG PO TABS: 1.0000 | ORAL_TABLET | Freq: Four times a day (QID) | ORAL | Status: DC | PRN

## 2018-10-19 NOTE — Progress Notes (Signed)
Patient transferred to room 1531, agree with previous assessment

## 2018-10-19 NOTE — Progress Notes (Signed)
Morphine PCA discontinued per Dr. Delanna Ahmadi order. Wasted 19 ml in stericycle with witness Ian Bushman, RN.

## 2018-10-19 NOTE — Progress Notes (Signed)
1 Day Post-Op Procedure(s) (LRB): ATTEMPTED LAPAROSCOPIC  ASSISTED VAGINAL HYSTERECTOMY WITH SALPINGECTOMY CONVERTED TO OPEN (Bilateral)  Subjective: Patient reports tolerating PO.    Objective: I have reviewed patient's vital signs, medications and labs.  abd soft + BS, dressing dry CBC    Component Value Date/Time   WBC 7.8 10/19/2018 0617   RBC 3.66 (L) 10/19/2018 0617   HGB 10.6 (L) 10/19/2018 0617   HCT 33.6 (L) 10/19/2018 0617   PLT 252 10/19/2018 0617   MCV 91.8 10/19/2018 0617   MCH 29.0 10/19/2018 0617   MCHC 31.5 10/19/2018 0617   RDW 13.6 10/19/2018 0617    Assessment: s/p Procedure(s): ATTEMPTED LAPAROSCOPIC  ASSISTED VAGINAL HYSTERECTOMY WITH SALPINGECTOMY CONVERTED TO OPEN (Bilateral): stable  Plan: Advance diet Encourage ambulation Advance to PO medication Discontinue IV fluids  LOS: 0 days    Margarette Asal 10/19/2018, 8:08 AM

## 2018-10-20 ENCOUNTER — Other Ambulatory Visit: Payer: Self-pay

## 2018-10-20 MED ORDER — OXYCODONE-ACETAMINOPHEN 5-325 MG PO TABS: 1.0000 | ORAL_TABLET | Freq: Four times a day (QID) | ORAL | 0 refills | Status: AC | PRN

## 2018-10-20 MED ORDER — IBUPROFEN 800 MG PO TABS: 800.0000 mg | ORAL_TABLET | Freq: Three times a day (TID) | ORAL | 0 refills | Status: AC | PRN

## 2018-10-20 NOTE — Discharge Summary (Signed)
Physician Discharge Summary  Patient ID: Cassandra Mccormick MRN: [id number] DOB/AGE: 1966-08-27 52 y.o.  Admit date: 10/18/2018 Discharge date: 10/20/2018  Admission [place], [place]  Discharge Diagnoses: SAME PLUS PELVIC ADHESIONS Active Problems:   Menorrhagia   Leiomyoma   Discharged Condition: good  Hospital Course: adm for DL>>poss LAVH, >>>TAH, BS due to extensive ant abd wall adhesions, on POD 1, diet advanced, by POD 2, was afeb, ambulating and ready for D/C  Consults: None  Significant Diagnostic Studies: labs:  CBC    Component Value Date/Time   WBC 7.8 10/19/2018 0617   RBC 3.66 (L) 10/19/2018 0617   HGB 10.6 (L) 10/19/2018 0617   HCT 33.6 (L) 10/19/2018 0617   PLT 252 10/19/2018 0617   MCV 91.8 10/19/2018 0617   MCH 29.0 10/19/2018 0617   MCHC 31.5 10/19/2018 0617   RDW 13.6 10/19/2018 0617     Treatments: surgery: DL>>TAH, BS  Discharge Exam: Blood pressure 130/71, pulse 84, temperature 99.1 F (37.3 C), temperature source Oral, resp. rate 18, height 5\' 3"  (1.6 m), weight 76 kg, last menstrual period 09/20/2018, SpO2 93 %. abd doft, flat + BS, dressing dry  Disposition: Discharge disposition: 01-Home or Self Care        Allergies as of 10/20/2018   No Known Allergies     Medication List    TAKE these medications   ibuprofen 800 MG tablet Commonly known as:  ADVIL,MOTRIN Take 1 tablet (800 mg total) by mouth every 8 (eight) hours as needed (mild pain).   oxyCODONE-acetaminophen 5-325 MG tablet Commonly known as:  PERCOCET/ROXICET Take 1-2 tablets by mouth every 6 (six) hours as needed for severe pain.      Follow-up Information    Molli Posey, MD. Schedule an appointment as soon as possible for a visit in 1 week(s).   Specialty:  Obstetrics and Gynecology Contact information: [address] [place] [address] [phone]           Signed: Margarette Asal 10/20/2018, 8:31 AM

## 2019-10-19 ENCOUNTER — Other Ambulatory Visit: Payer: Self-pay | Admitting: Obstetrics and Gynecology

## 2019-10-19 DIAGNOSIS — L03818 Cellulitis of other sites: Secondary | ICD-10-CM

## 2019-10-28 ENCOUNTER — Ambulatory Visit: Payer: BLUE CROSS/BLUE SHIELD

## 2019-10-28 ENCOUNTER — Other Ambulatory Visit: Payer: Self-pay

## 2019-10-28 ENCOUNTER — Ambulatory Visit
Admission: RE | Admit: 2019-10-28 | Discharge: 2019-10-28 | Disposition: A | Discharge status: Home / Self Care | Payer: BC Managed Care – PPO | Source: Ambulatory Visit | Attending: Obstetrics and Gynecology | Admitting: Obstetrics and Gynecology

## 2019-10-28 DIAGNOSIS — L03818 Cellulitis of other sites: Secondary | ICD-10-CM

## 2020-02-16 ENCOUNTER — Ambulatory Visit: Payer: BC Managed Care – PPO | Attending: Internal Medicine

## 2020-02-16 DIAGNOSIS — Z23 Encounter for immunization: Secondary | ICD-10-CM

## 2020-02-16 NOTE — Progress Notes (Signed)
   Covid-19 Vaccination Clinic  Name:  Cassandra Mccormick    MRN: [id number] DOB: 25-Feb-1966  02/16/2020  [name] was observed post Covid-19 immunization for 15 minutes without incident. She was provided with Vaccine Information Sheet and instruction to access the V-Safe system.   [name] was instructed to call 911 with any severe reactions post vaccine: Marland Kitchen Difficulty breathing  . Swelling of face and throat  . A fast heartbeat  . A bad rash all over body  . Dizziness and weakness   Immunizations Administered    Name Date Dose VIS Date Route   Pfizer COVID-19 Vaccine 02/16/2020 12:26 PM 0.3 mL 10/28/2019 Intramuscular   Manufacturer: [place]   Lot: [id number]   [place]: [id number]

## 2020-03-12 ENCOUNTER — Ambulatory Visit: Payer: BC Managed Care – PPO | Attending: Internal Medicine

## 2020-03-12 DIAGNOSIS — Z23 Encounter for immunization: Secondary | ICD-10-CM

## 2020-03-12 NOTE — Progress Notes (Signed)
   Covid-19 Vaccination Clinic  Name:  Cassandra Mccormick    MRN: [id number] DOB: [birth year]  03/12/2020  [name] was observed post Covid-19 immunization for 15 minutes without incident. She was provided with Vaccine Information Sheet and instruction to access the V-Safe system.   [name] was instructed to call 911 with any severe reactions post vaccine: Marland Kitchen Difficulty breathing  . Swelling of face and throat  . A fast heartbeat  . A bad rash all over body  . Dizziness and weakness   Immunizations Administered    Name Date Dose VIS Date Route   Pfizer COVID-19 Vaccine 03/12/2020  4:22 PM 0.3 mL 01/11/2019 Intramuscular   Manufacturer: [place]   Lot: [id number]   [place]: [id number]

## 2021-05-24 IMAGING — MG MM DIGITAL DIAGNOSTIC UNILAT*L* W/ TOMO W/ CAD
4 series · 4 of 12 positions shown · non-contrast
Comparison: Previous exam(s).

CLINICAL DATA: History of suspected mastitis last week with skin
redness and breast pain, now with no residual symptoms status post
completion of a 7 day course of antibiotics.

EXAM:
DIGITAL DIAGNOSTIC UNILATERAL LEFT MAMMOGRAM WITH CAD AND TOMO

[L CC synth-2D]
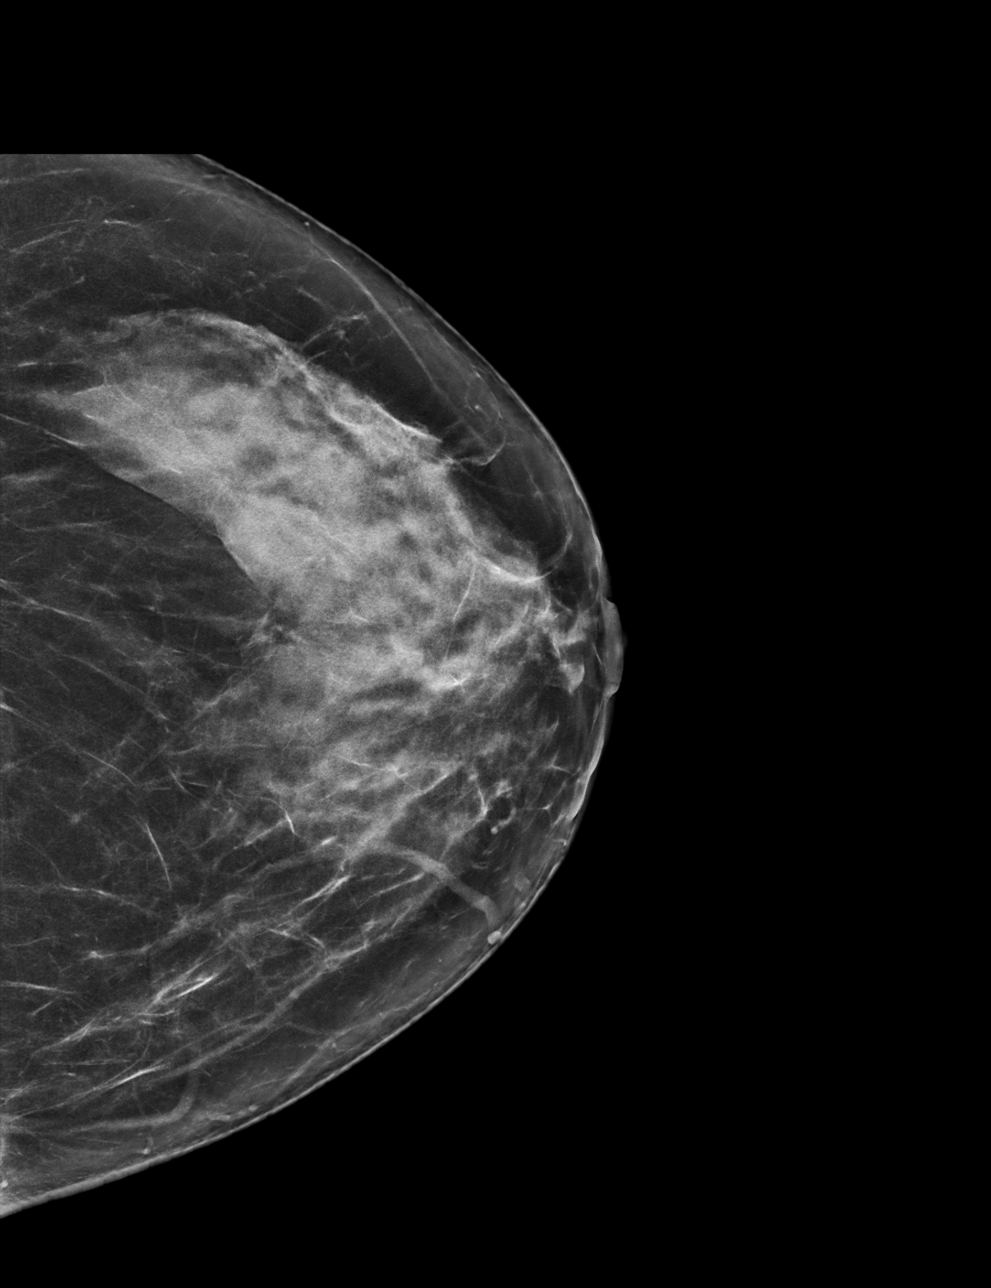

[L MLO synth-2D]
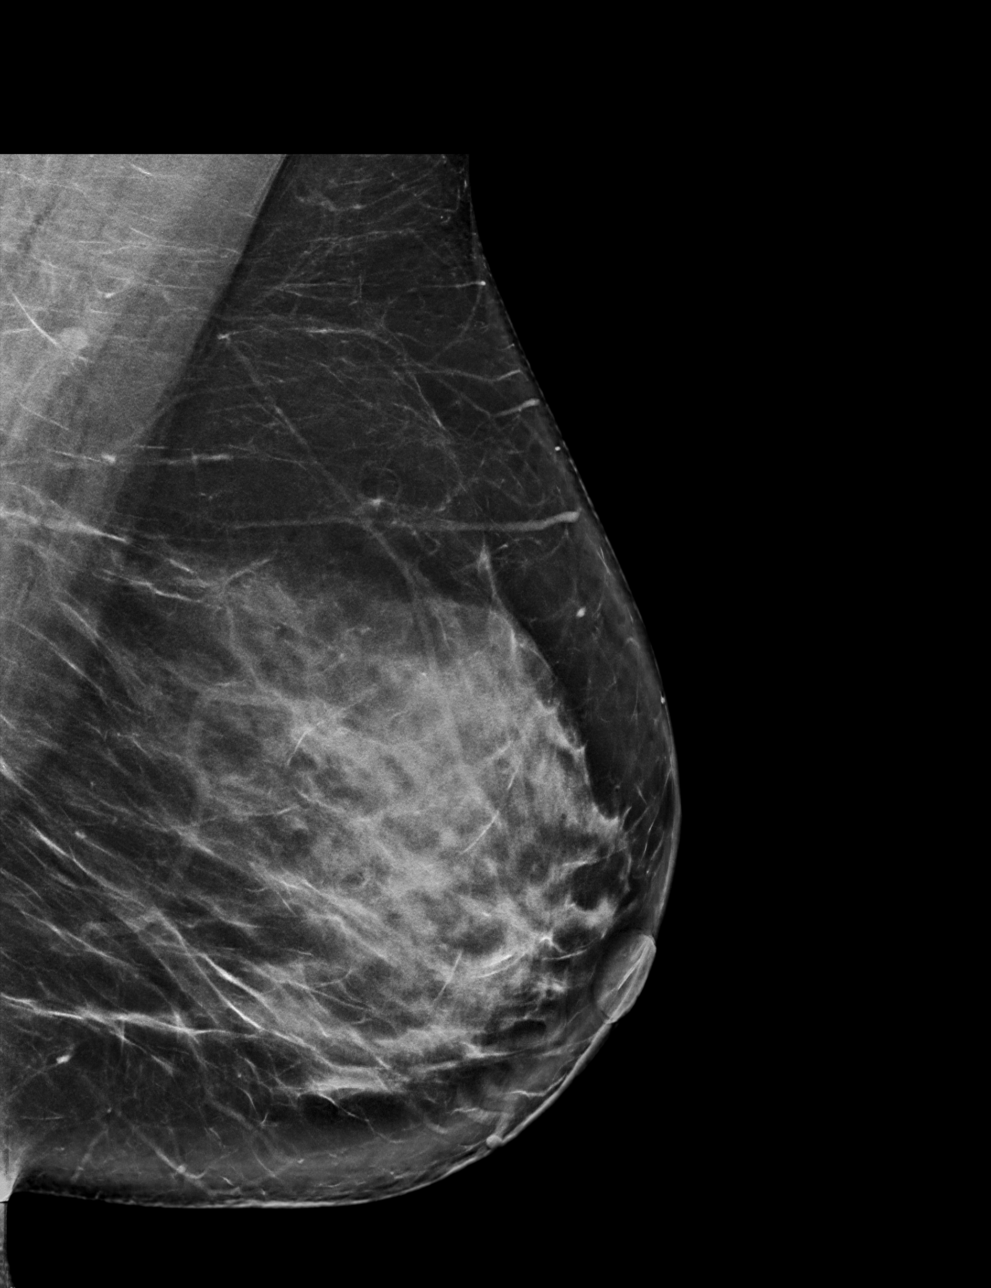

[L CC tomo · tomo slice 35/70.0]
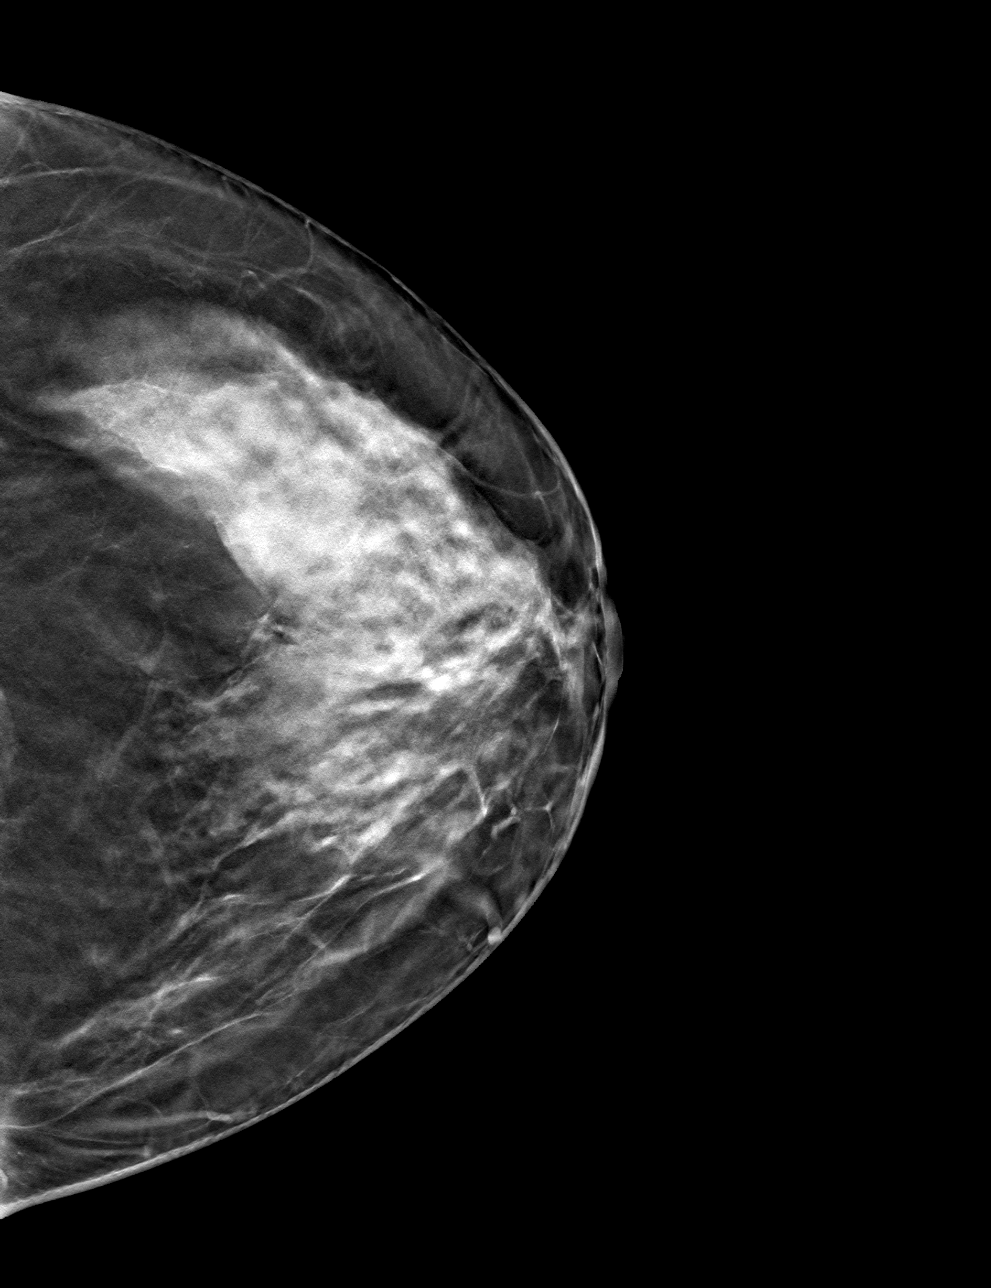

[L MLO tomo · tomo slice 43/84.0]
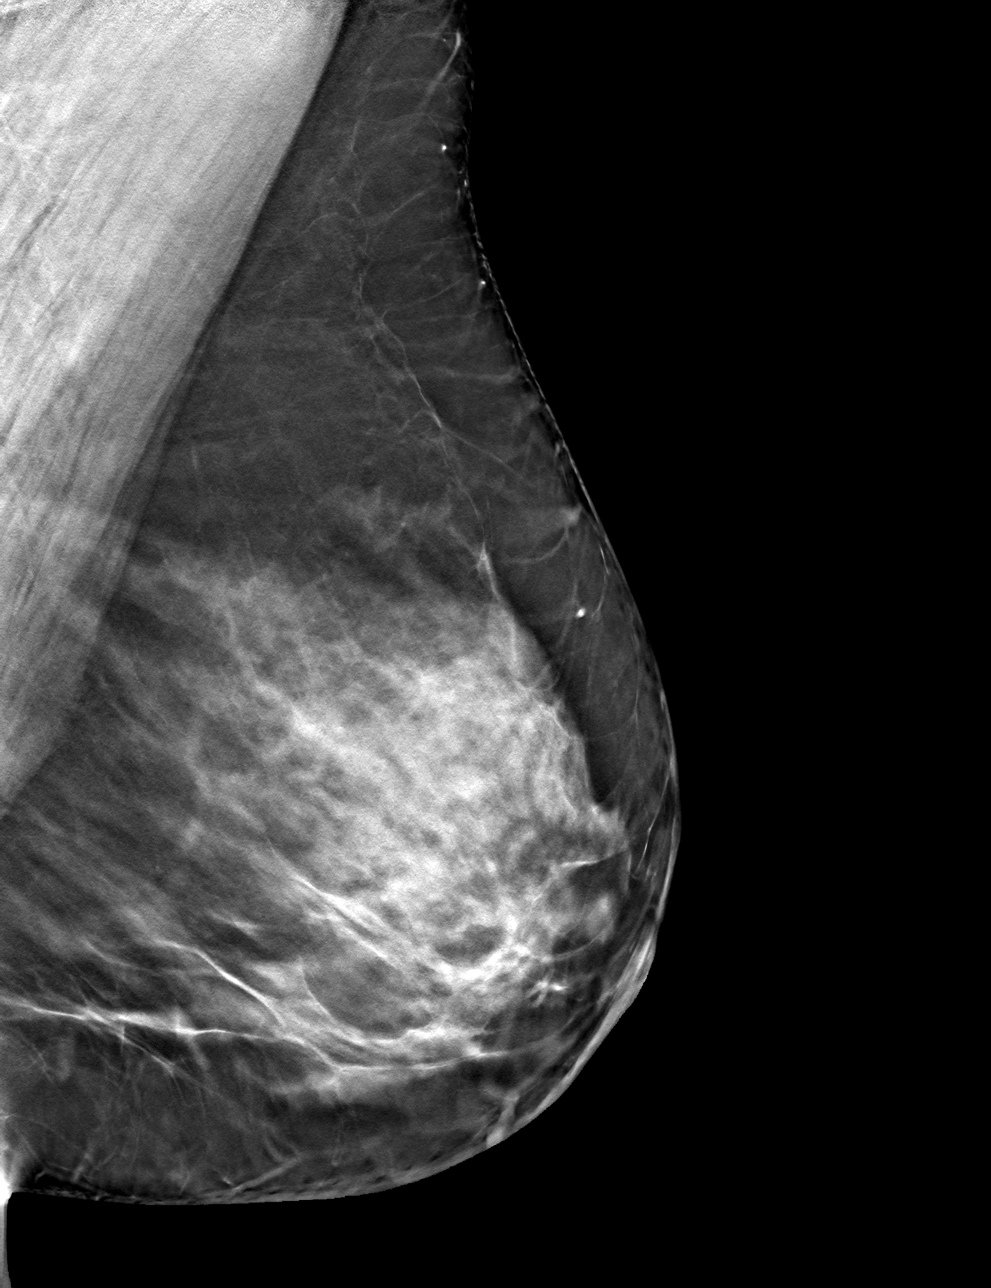

[4 of 12 positions shown; findings below may reference images not displayed]

ACR Breast Density Category c: The breast tissue is heterogeneously
dense, which may obscure small masses.
FINDINGS: There are no dominant masses, suspicious calcifications or secondary
signs of malignancy within the LEFT breast. No skin thickening or
evidence of breast edema.

Mammographic images were processed with CAD.
IMPRESSION: No evidence of malignancy or acute findings on today's exam.

RECOMMENDATION:
Annual screening mammograms. Next RIGHT breast screening mammogram
will be due in Monday November, 2019.

I have discussed the findings and recommendations with the patient.
If applicable, a reminder letter will be sent to the patient
regarding the next appointment.

BI-RADS CATEGORY  1: Negative.
